# Patient Record
Sex: Male | Born: 1995
Health system: Southern US, Community
[De-identification: ages and names within clinical notes are randomized; demographics above are authoritative.]

## PROBLEM LIST (undated history)

## (undated) DIAGNOSIS — R011 Cardiac murmur, unspecified: Secondary | ICD-10-CM

## (undated) HISTORY — PX: NO PAST SURGERIES: SHX2092

---
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
## 2014-07-19 ENCOUNTER — Emergency Department (HOSPITAL_COMMUNITY)
Admission: EM | Admit: 2014-07-19 | Discharge: 2014-07-19 | Disposition: A | Discharge status: Home / Self Care | Payer: Managed Care, Other (non HMO) | Attending: Emergency Medicine | Admitting: Emergency Medicine

## 2014-07-19 ENCOUNTER — Emergency Department (HOSPITAL_COMMUNITY): Payer: Managed Care, Other (non HMO)

## 2014-07-19 ENCOUNTER — Encounter (HOSPITAL_COMMUNITY): Payer: Self-pay | Admitting: Emergency Medicine

## 2014-07-19 DIAGNOSIS — R011 Cardiac murmur, unspecified: Secondary | ICD-10-CM | POA: Diagnosis not present

## 2014-07-19 DIAGNOSIS — Y998 Other external cause status: Secondary | ICD-10-CM | POA: Insufficient documentation

## 2014-07-19 DIAGNOSIS — Y9389 Activity, other specified: Secondary | ICD-10-CM | POA: Diagnosis not present

## 2014-07-19 DIAGNOSIS — S20211A Contusion of right front wall of thorax, initial encounter: Secondary | ICD-10-CM | POA: Insufficient documentation

## 2014-07-19 DIAGNOSIS — S0081XA Abrasion of other part of head, initial encounter: Secondary | ICD-10-CM | POA: Diagnosis not present

## 2014-07-19 DIAGNOSIS — S069X9A Unspecified intracranial injury with loss of consciousness of unspecified duration, initial encounter: Secondary | ICD-10-CM

## 2014-07-19 DIAGNOSIS — S5012XA Contusion of left forearm, initial encounter: Secondary | ICD-10-CM | POA: Diagnosis not present

## 2014-07-19 DIAGNOSIS — Y92410 Unspecified street and highway as the place of occurrence of the external cause: Secondary | ICD-10-CM | POA: Diagnosis not present

## 2014-07-19 DIAGNOSIS — T07XXXA Unspecified multiple injuries, initial encounter: Secondary | ICD-10-CM

## 2014-07-19 DIAGNOSIS — S199XXA Unspecified injury of neck, initial encounter: Secondary | ICD-10-CM | POA: Insufficient documentation

## 2014-07-19 DIAGNOSIS — S0990XA Unspecified injury of head, initial encounter: Secondary | ICD-10-CM | POA: Diagnosis present

## 2014-07-19 MED ORDER — IBUPROFEN 600 MG PO TABS: 600.0000 mg | ORAL_TABLET | Freq: Four times a day (QID) | ORAL | Status: AC | PRN

## 2014-07-19 MED ORDER — ONDANSETRON 8 MG PO TBDP
8.0000 mg | ORAL_TABLET | Freq: Once | ORAL | Status: AC
  Administered 2014-07-19: 8 mg via ORAL
  Filled 2014-07-19: qty 1

## 2014-07-19 MED ORDER — ONDANSETRON HCL 8 MG PO TABS: 8.0000 mg | ORAL_TABLET | Freq: Three times a day (TID) | ORAL | Status: DC | PRN

## 2014-07-19 MED ORDER — ACETAMINOPHEN 500 MG PO TABS
1000.0000 mg | ORAL_TABLET | Freq: Once | ORAL | Status: AC
  Administered 2014-07-19: 1000 mg via ORAL
  Filled 2014-07-19: qty 2
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
## 2015-01-09 ENCOUNTER — Ambulatory Visit (INDEPENDENT_AMBULATORY_CARE_PROVIDER_SITE_OTHER): Payer: Managed Care, Other (non HMO) | Admitting: Neurology

## 2015-01-09 ENCOUNTER — Encounter: Payer: Self-pay | Admitting: Neurology

## 2015-01-09 VITALS — BP 112/78 | HR 73 | Ht 72.5 in | Wt 121.0 lb

## 2015-01-09 DIAGNOSIS — S060X9A Concussion with loss of consciousness of unspecified duration, initial encounter: Secondary | ICD-10-CM

## 2015-01-09 DIAGNOSIS — F0781 Postconcussional syndrome: Secondary | ICD-10-CM | POA: Diagnosis not present

## 2015-01-09 MED ORDER — NORTRIPTYLINE HCL 10 MG PO CAPS: 10.0000 mg | ORAL_CAPSULE | Freq: Every day | ORAL | Status: DC

## 2015-01-09 NOTE — Progress Notes (Signed)
Chart forwarded.  

## 2015-01-09 NOTE — Patient Instructions (Signed)
1.  Start nortriptyline 10mg  at bedtime.  This will help with the headaches, depression and difficulty sleeping 2.  Limit use of pain relievers to no more than 2 days out of the week 3.  We will order an EEG to record your brain waves 4.  Call in 4 weeks with update.  Follow up in 3 months.
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
## 2015-02-06 ENCOUNTER — Ambulatory Visit: Payer: Managed Care, Other (non HMO) | Admitting: Neurology

## 2015-02-10 ENCOUNTER — Telehealth: Payer: Self-pay | Admitting: Neurology

## 2015-02-10 MED ORDER — NORTRIPTYLINE HCL 25 MG PO CAPS: 25.0000 mg | ORAL_CAPSULE | Freq: Every day | ORAL | Status: AC

## 2015-02-10 NOTE — Telephone Encounter (Signed)
Increase nortriptyline to 25mg  at bedtime and to update us in another 4 weeks.

## 2015-02-10 NOTE — Telephone Encounter (Signed)
Mom called with 1 month update on nortriptyline. During the first week or two patient did notice a difference in his headaches. Since that time, headaches have gradually returned with no relief. Pt is also still not sleeping well. Is in need of a refill. Please advise.

## 2015-02-10 NOTE — Telephone Encounter (Signed)
Mom aware. Rx sent in.

## 2015-02-10 NOTE — Telephone Encounter (Signed)
Pt mother called and states that she needs to talk to someone about her son he is not sleeping at night and cant tell a difference with the medication please call [phone]

## 2015-02-26 ENCOUNTER — Telehealth: Payer: Self-pay | Admitting: Neurology

## 2015-02-26 NOTE — Telephone Encounter (Signed)
Pt mother wants to talk to someone about her son medication she states that we went up on the dosage and he is not doing well with the change please call Lawson Fiscal at [phone]

## 2015-02-26 NOTE — Telephone Encounter (Signed)
Spoke with patient's mother. She states that since increasing Nortriptyline that patient has had no decrease in the headaches and is having all day grogginess. He states that he felt better on lower dosage that he can deal with headaches more than fatigue. Please advise.

## 2015-02-26 NOTE — Telephone Encounter (Signed)
He may discontinue the nortriptyline (no taper)  Instead, I would like him to try the follow supplements:  To help reduce HEADACHES: 1.  Coenzyme Q10  ONCE DAILY 2.  Riboflavin/Vitamin B2  ONCE DAILY 3.  Magnesium oxide  ONCE - TWICE DAILY May stop after headaches are resolved.    Other medicines to help decrease inflammation 1.  Alpha Lipoic Acid  TWICE DAILY 2.  Turmeric  twice daily

## 2015-02-26 NOTE — Telephone Encounter (Signed)
Called patient's mother and made her aware. She has requested that we send this through a message on mychart so that she can get the supplement suggestions. She states she will sign up for mychart tonight, then we will send this in a message.

## 2015-02-27 NOTE — Telephone Encounter (Signed)
Mychart still inactive. Letter mailed to patient.

## 2015-05-08 ENCOUNTER — Ambulatory Visit: Payer: Managed Care, Other (non HMO) | Admitting: Neurology

## 2015-05-08 DIAGNOSIS — Z029 Encounter for administrative examinations, unspecified: Secondary | ICD-10-CM

## 2018-01-18 DIAGNOSIS — Z0000 Encounter for general adult medical examination without abnormal findings: Secondary | ICD-10-CM | POA: Diagnosis not present

## 2018-01-18 DIAGNOSIS — Z681 Body mass index (BMI) 19 or less, adult: Secondary | ICD-10-CM | POA: Diagnosis not present

## 2018-01-18 DIAGNOSIS — Z1389 Encounter for screening for other disorder: Secondary | ICD-10-CM | POA: Diagnosis not present

## 2018-03-19 ENCOUNTER — Emergency Department (HOSPITAL_COMMUNITY): Payer: Worker's Compensation

## 2018-03-19 ENCOUNTER — Encounter (HOSPITAL_COMMUNITY): Payer: Self-pay | Admitting: *Deleted

## 2018-03-19 ENCOUNTER — Emergency Department (HOSPITAL_COMMUNITY)
Admission: EM | Admit: 2018-03-19 | Discharge: 2018-03-19 | Disposition: A | Discharge status: Home / Self Care | Payer: Worker's Compensation | Attending: Emergency Medicine | Admitting: Emergency Medicine

## 2018-03-19 ENCOUNTER — Other Ambulatory Visit: Payer: Self-pay

## 2018-03-19 DIAGNOSIS — Y990 Civilian activity done for income or pay: Secondary | ICD-10-CM | POA: Diagnosis not present

## 2018-03-19 DIAGNOSIS — S62639B Displaced fracture of distal phalanx of unspecified finger, initial encounter for open fracture: Secondary | ICD-10-CM

## 2018-03-19 DIAGNOSIS — Y929 Unspecified place or not applicable: Secondary | ICD-10-CM | POA: Diagnosis not present

## 2018-03-19 DIAGNOSIS — S67194A Crushing injury of right ring finger, initial encounter: Secondary | ICD-10-CM | POA: Diagnosis present

## 2018-03-19 DIAGNOSIS — S62634B Displaced fracture of distal phalanx of right ring finger, initial encounter for open fracture: Secondary | ICD-10-CM | POA: Diagnosis not present

## 2018-03-19 DIAGNOSIS — W230XXA Caught, crushed, jammed, or pinched between moving objects, initial encounter: Secondary | ICD-10-CM | POA: Diagnosis not present

## 2018-03-19 DIAGNOSIS — Z79899 Other long term (current) drug therapy: Secondary | ICD-10-CM | POA: Insufficient documentation

## 2018-03-19 DIAGNOSIS — Y939 Activity, unspecified: Secondary | ICD-10-CM | POA: Insufficient documentation

## 2018-03-19 DIAGNOSIS — S6710XA Crushing injury of unspecified finger(s), initial encounter: Secondary | ICD-10-CM

## 2018-03-19 MED ORDER — SULFAMETHOXAZOLE-TRIMETHOPRIM 800-160 MG PO TABS: 1.0000 | ORAL_TABLET | Freq: Two times a day (BID) | ORAL | 0 refills | Status: AC

## 2018-03-19 MED ORDER — BUPIVACAINE HCL (PF) 0.5 % IJ SOLN
10.0000 mL | Freq: Once | INTRAMUSCULAR | Status: AC
Start: 2018-03-19 — End: 2018-03-19
  Administered 2018-03-19: 10 mL
  Filled 2018-03-19: qty 30

## 2018-03-19 MED ORDER — HYDROCODONE-ACETAMINOPHEN 5-325 MG PO TABS: 1.0000 | ORAL_TABLET | ORAL | 0 refills | Status: AC | PRN

## 2018-03-19 MED ORDER — SULFAMETHOXAZOLE-TRIMETHOPRIM 800-160 MG PO TABS
1.0000 | ORAL_TABLET | Freq: Once | ORAL | Status: AC
  Administered 2018-03-19: 1 via ORAL
  Filled 2018-03-19: qty 1

## 2018-03-19 NOTE — ED Triage Notes (Signed)
Pt c/o smashed in between two rollers at work tonight, unsure of last tetanus,

## 2018-03-20 NOTE — ED Provider Notes (Signed)
[facility] EMERGENCY DEPARTMENT Provider Note   CSN: [id number] Arrival date & time: 03/19/18  2036     History   Chief Complaint Chief Complaint  Patient presents with  . Finger Injury    HPI Noah Dudley is a 23 y.o. male.  The history is provided by the patient. No language interpreter was used.  Hand Pain  This is a new problem. The current episode started less than 1 hour ago. The problem has been gradually worsening. Nothing aggravates the symptoms. Nothing relieves the symptoms. He has tried nothing for the symptoms. The treatment provided no relief.  Pt caught his fingers in between 2 rollers. Pt complains of nail coming off and pain  Past Medical History:  Diagnosis Date  . Heart murmur     Patient Active Problem List   Diagnosis Date Noted  . Postconcussion syndrome 01/09/2015    Past Surgical History:  Procedure Laterality Date  . NO PAST SURGERIES          Home Medications    Prior to Admission medications   Medication Sig Start Date End Date Taking? Authorizing Provider  HYDROcodone-acetaminophen (NORCO/VICODIN) 5-325 MG tablet Take 1 tablet by mouth every 4 (four) hours as needed. 03/19/18   Elson AreasSofia, Leslie K, PA-C  ibuprofen (ADVIL,MOTRIN) 600 MG tablet Take 1 tablet (600 mg total) by mouth every 6 (six) hours as needed. 07/19/14   Burgess AmorIdol, Julie, PA-C  nortriptyline (PAMELOR) 25 MG capsule Take 1 capsule (25 mg total) by mouth at bedtime. 02/10/15   Drema DallasJaffe, Adam R, DO  sulfamethoxazole-trimethoprim (BACTRIM DS,SEPTRA DS) 800-160 MG tablet Take 1 tablet by mouth 2 (two) times daily for 7 days. 03/19/18 03/26/18  Elson AreasSofia, Leslie K, PA-C    Family History No family history on file.  Social History Social History   Tobacco Use  . Smoking status: Never Smoker  . Smokeless tobacco: Never Used  Substance Use Topics  . Alcohol use: Yes    Alcohol/week: 0.0 standard drinks    Comment: occasionally  . Drug use: No     Allergies   Patient has no known  allergies.   Review of Systems Review of Systems  All other systems reviewed and are negative.    Physical Exam Updated Vital Signs BP 122/88 (BP Location: Right Arm)   Pulse 78   Temp 98 F (36.7 C) (Oral)   Resp 17   Ht 6\' 1"  (1.854 m)   Wt 59 kg   SpO2 100%   BMI 17.15 kg/m   Physical Exam Vitals signs and nursing note reviewed.  Constitutional:      Appearance: He is well-developed.  HENT:     Head: Normocephalic.  Neck:     Musculoskeletal: Normal range of motion.  Pulmonary:     Effort: Pulmonary effort is normal.  Abdominal:     General: There is no distension.  Musculoskeletal: Normal range of motion.     Comments: Right ring finger,  Nail avulsed except corner,  From  nv and ns intact   Neurological:     Mental Status: He is alert and oriented to person, place, and time.      ED Treatments / Results  Labs (all labs ordered are listed, but only abnormal results are displayed) Labs Reviewed - No data to display  EKG None  Radiology Dg Finger Ring Right  Result Date: 03/19/2018 CLINICAL DATA:  Crush injury to the distal fourth digit, initial encounter EXAM: RIGHT RING FINGER 2+V COMPARISON:  None. FINDINGS: Undisplaced distal phalangeal tuft fracture is noted. No other focal abnormality is seen. IMPRESSION: Undisplaced distal phalangeal tuft fracture Electronically Signed   By: Alcide CleverMark  Lukens M.D.   On: 03/19/2018 21:16    Procedures .Marland Kitchen.Laceration Repair Date/Time: 03/20/2018 9:55 PM Performed by: Elson AreasSofia, Leslie K, PA-C Authorized by: Elson AreasSofia, Leslie K, PA-C   Consent:    Consent obtained:  Verbal   Consent given by:  Patient   Risks discussed:  Infection and pain   Alternatives discussed:  No treatment Anesthesia (see MAR for exact dosages):    Anesthesia method:  Local infiltration   Local anesthetic:  Bupivacaine 0.5% w/o epi Laceration details:    Length (cm):  0.4 Exploration:    Wound exploration: wound explored through full range of  motion   Treatment:    Area cleansed with:  Betadine Skin repair:    Repair method:  Sutures   Suture material:  Chromic gut   Suture technique:  Simple interrupted   Number of sutures:  3 Approximation:    Approximation:  Loose Post-procedure details:    Patient tolerance of procedure:  Tolerated well, no immediate complications Comments:     Nail bed repaired with 3 sutures, wound cleaned,  3.0 prolene to replace nail    (including critical care time)  Medications Ordered in ED Medications  bupivacaine (MARCAINE) 0.5 % injection 10 mL (10 mLs Infiltration Given 03/19/18 2224)  sulfamethoxazole-trimethoprim (BACTRIM DS,SEPTRA DS) 800-160 MG per tablet 1 tablet (1 tablet Oral Given 03/19/18 2244)     Initial Impression / Assessment and Plan / ED Course  I have reviewed the triage vital signs and the nursing notes.  Pertinent labs & imaging results that were available during my care of the patient were reviewed by me and considered in my medical decision making (see chart for details).     MDM  Pt counseled on nail bed laceration.  Pt advised to follow up with Dr. Romeo AppleHarrison for recheck.   Final Clinical Impressions(s) / ED Diagnoses   Final diagnoses:  Crushing injury of finger, initial encounter  Open fracture of tuft of distal phalanx of finger    ED Discharge Orders         Ordered    HYDROcodone-acetaminophen (NORCO/VICODIN) 5-325 MG tablet  Every 4 hours PRN     03/19/18 2239    sulfamethoxazole-trimethoprim (BACTRIM DS,SEPTRA DS) 800-160 MG tablet  2 times daily     03/19/18 2239        An After Visit Summary was printed and given to the patient.    Osie CheeksSofia, Leslie K, PA-C 03/20/18 2206    Raeford RazorKohut, Stephen, MD 03/21/18 1229

## 2019-01-08 ENCOUNTER — Other Ambulatory Visit: Payer: Self-pay

## 2019-01-08 DIAGNOSIS — Z20822 Contact with and (suspected) exposure to covid-19: Secondary | ICD-10-CM

## 2019-01-10 LAB — NOVEL CORONAVIRUS, NAA: SARS-CoV-2, NAA: NOT DETECTED

## 2019-12-06 IMAGING — DX DG FINGER RING 2+V*R*
3 series · 3 of 3 positions shown · non-contrast
Comparison: None.

CLINICAL DATA: Crush injury to the distal fourth digit, initial
encounter

EXAM:
RIGHT RING FINGER 2+V

[finger ap]
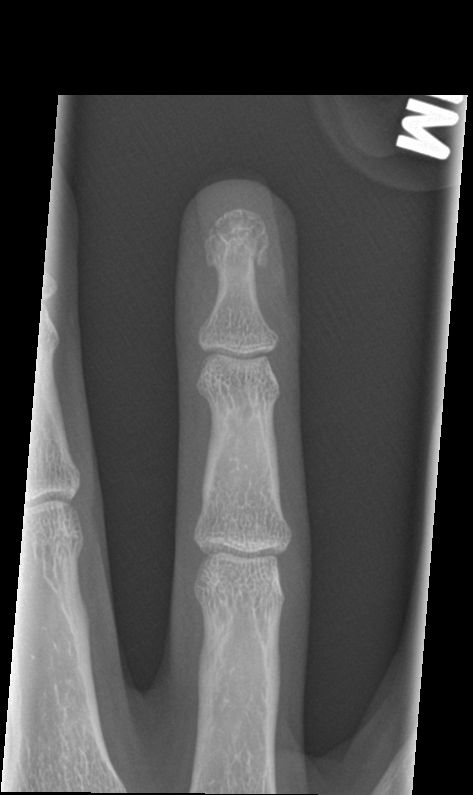

[finger obl]
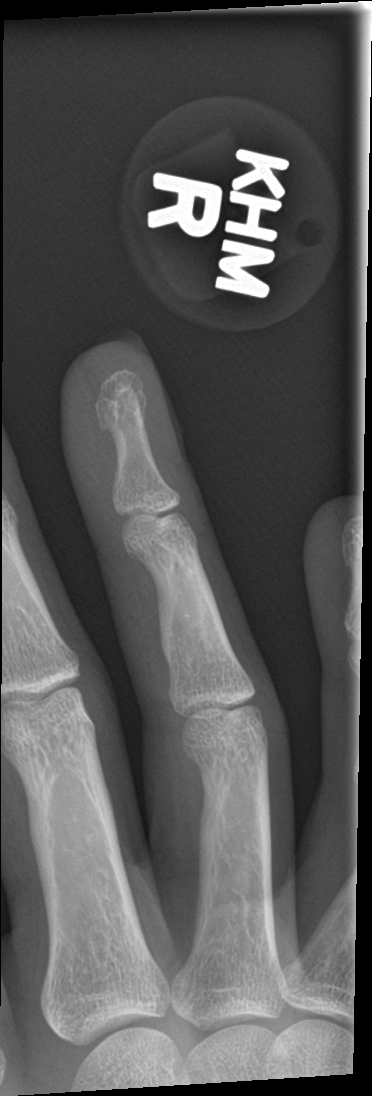

[finger lat]
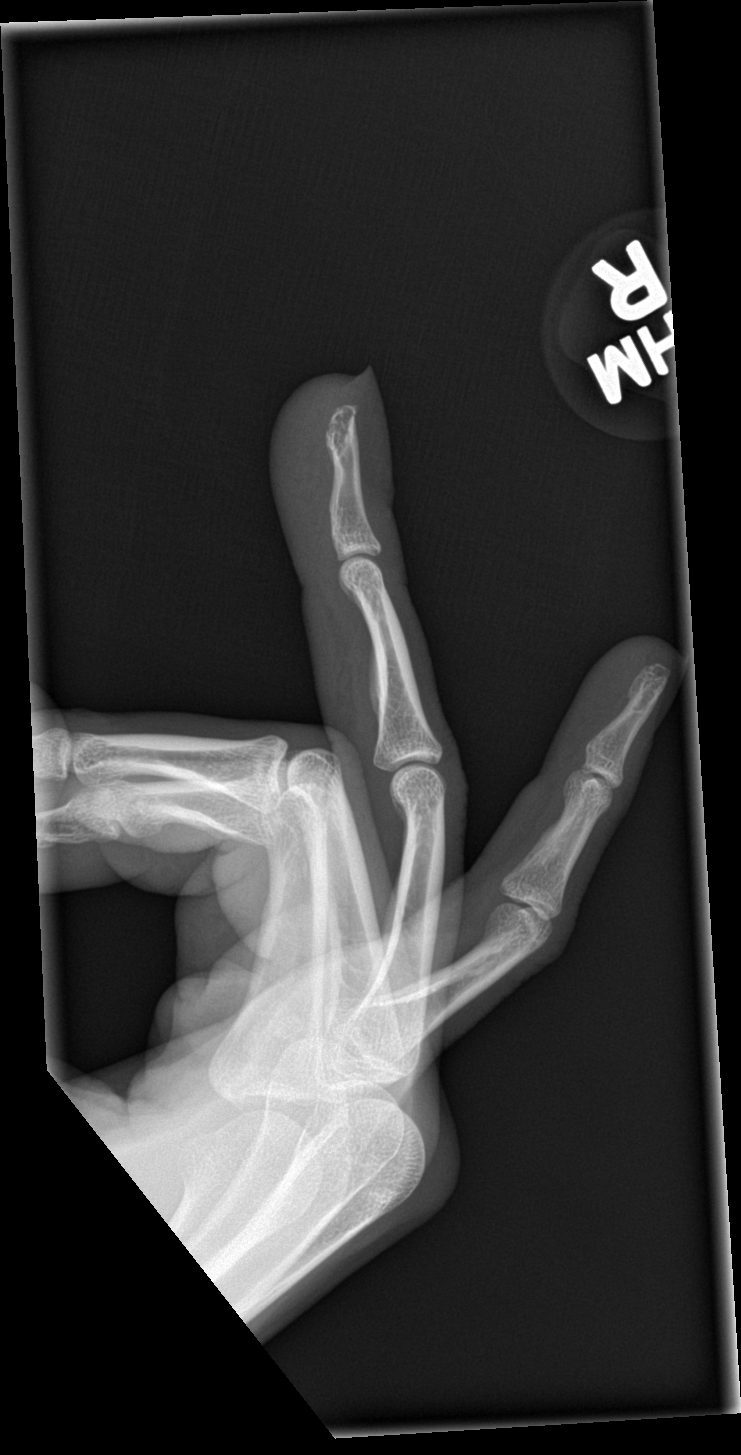

[3 of 3 positions shown; findings below may reference images not displayed]

FINDINGS: Undisplaced distal phalangeal tuft fracture is noted. No other focal
abnormality is seen.
IMPRESSION: Undisplaced distal phalangeal tuft fracture
